# Patient Record
Sex: Male | Born: 1995 | Race: Asian | Hispanic: No | Marital: Single | Smoking: Never smoker
Health system: Southern US, Community
[De-identification: ages and names within clinical notes are randomized; demographics above are authoritative.]

---

## 2020-01-27 DIAGNOSIS — L2089 Other atopic dermatitis: Secondary | ICD-10-CM | POA: Diagnosis not present

## 2020-02-17 DIAGNOSIS — Z20822 Contact with and (suspected) exposure to covid-19: Secondary | ICD-10-CM | POA: Diagnosis not present

## 2020-03-05 DIAGNOSIS — Z20822 Contact with and (suspected) exposure to covid-19: Secondary | ICD-10-CM | POA: Diagnosis not present

## 2020-03-06 ENCOUNTER — Other Ambulatory Visit: Payer: Self-pay

## 2020-03-06 ENCOUNTER — Emergency Department (HOSPITAL_COMMUNITY)
Admission: EM | Admit: 2020-03-06 | Discharge: 2020-03-07 | Disposition: A | Discharge status: Home / Self Care | Payer: BC Managed Care – PPO | Attending: Emergency Medicine | Admitting: Emergency Medicine

## 2020-03-06 ENCOUNTER — Emergency Department (HOSPITAL_COMMUNITY): Payer: BC Managed Care – PPO

## 2020-03-06 ENCOUNTER — Encounter (HOSPITAL_COMMUNITY): Payer: Self-pay

## 2020-03-06 DIAGNOSIS — R630 Anorexia: Secondary | ICD-10-CM | POA: Diagnosis not present

## 2020-03-06 DIAGNOSIS — R1011 Right upper quadrant pain: Secondary | ICD-10-CM | POA: Diagnosis not present

## 2020-03-06 DIAGNOSIS — R109 Unspecified abdominal pain: Secondary | ICD-10-CM

## 2020-03-06 DIAGNOSIS — B349 Viral infection, unspecified: Secondary | ICD-10-CM | POA: Insufficient documentation

## 2020-03-06 DIAGNOSIS — R519 Headache, unspecified: Secondary | ICD-10-CM | POA: Insufficient documentation

## 2020-03-06 DIAGNOSIS — M791 Myalgia, unspecified site: Secondary | ICD-10-CM | POA: Diagnosis not present

## 2020-03-06 DIAGNOSIS — R110 Nausea: Secondary | ICD-10-CM | POA: Diagnosis not present

## 2020-03-06 DIAGNOSIS — Z03818 Encounter for observation for suspected exposure to other biological agents ruled out: Secondary | ICD-10-CM | POA: Diagnosis not present

## 2020-03-06 DIAGNOSIS — R100 Acute abdomen: Secondary | ICD-10-CM | POA: Diagnosis not present

## 2020-03-06 LAB — CBC
HCT: 50.9 % (ref 39.0–52.0)
Hemoglobin: 16.6 g/dL (ref 13.0–17.0)
MCH: 26.4 pg (ref 26.0–34.0)
MCHC: 32.6 g/dL (ref 30.0–36.0)
MCV: 81.1 fL (ref 80.0–100.0)
Platelets: 244 10*3/uL (ref 150–400)
RBC: 6.28 MIL/uL — ABNORMAL HIGH (ref 4.22–5.81)
RDW: 12.5 % (ref 11.5–15.5)
WBC: 6.3 10*3/uL (ref 4.0–10.5)
nRBC: 0 % (ref 0.0–0.2)

## 2020-03-06 LAB — COMPREHENSIVE METABOLIC PANEL
ALT: 21 U/L (ref 0–44)
AST: 23 U/L (ref 15–41)
Albumin: 4.8 g/dL (ref 3.5–5.0)
Alkaline Phosphatase: 36 U/L — ABNORMAL LOW (ref 38–126)
Anion gap: 12 (ref 5–15)
BUN: 12 mg/dL (ref 6–20)
CO2: 21 mmol/L — ABNORMAL LOW (ref 22–32)
Calcium: 9.2 mg/dL (ref 8.9–10.3)
Chloride: 105 mmol/L (ref 98–111)
Creatinine, Ser: 0.74 mg/dL (ref 0.61–1.24)
GFR, Estimated: >60 mL/min (ref >60)
Glucose, Bld: 92 mg/dL (ref 70–99)
Potassium: 3.2 mmol/L — ABNORMAL LOW (ref 3.5–5.1)
Sodium: 138 mmol/L (ref 135–145)
Total Bilirubin: 1.1 mg/dL (ref 0.3–1.2)
Total Protein: 7.5 g/dL (ref 6.5–8.1)

## 2020-03-06 LAB — URINALYSIS, ROUTINE W REFLEX MICROSCOPIC
Bilirubin Urine: NEGATIVE
Glucose, UA: NEGATIVE mg/dL
Hgb urine dipstick: NEGATIVE
Ketones, ur: 80 mg/dL — AB
Leukocytes,Ua: NEGATIVE
Nitrite: NEGATIVE
Protein, ur: NEGATIVE mg/dL
Specific Gravity, Urine: 1.017 (ref 1.005–1.030)
pH: 5 (ref 5.0–8.0)

## 2020-03-06 LAB — LIPASE, BLOOD: Lipase: 30 U/L (ref 11–51)

## 2020-03-06 MED ORDER — ACETAMINOPHEN 325 MG PO TABS: 650.0000 mg | ORAL_TABLET | Freq: Four times a day (QID) | ORAL | 0 refills | Status: AC | PRN

## 2020-03-06 MED ORDER — IOHEXOL 300 MG/ML  SOLN
100.0000 mL | Freq: Once | INTRAMUSCULAR | Status: AC | PRN
  Administered 2020-03-06: 100 mL via INTRAVENOUS

## 2020-03-06 MED ORDER — DICYCLOMINE HCL 20 MG PO TABS: 20.0000 mg | ORAL_TABLET | Freq: Two times a day (BID) | ORAL | 0 refills | Status: AC

## 2020-03-06 MED ORDER — IBUPROFEN 600 MG PO TABS: 600.0000 mg | ORAL_TABLET | Freq: Four times a day (QID) | ORAL | 0 refills | Status: AC | PRN

## 2020-03-06 MED ORDER — HYOSCYAMINE SULFATE 0.125 MG SL SUBL: 0.2500 mg | SUBLINGUAL_TABLET | Freq: Once | SUBLINGUAL | Status: DC

## 2020-03-06 MED ORDER — ALUM & MAG HYDROXIDE-SIMETH 200-200-20 MG/5ML PO SUSP: 30.0000 mL | Freq: Once | ORAL | Status: DC

## 2020-03-06 NOTE — ED Notes (Signed)
Pt ambulated from triage to room gait steady with no assistance.

## 2020-03-06 NOTE — ED Triage Notes (Signed)
States since Friday he has had fatigue, muscle aches, and abdominal pain. Seen at [facility] today and sent to ED.

## 2020-03-06 NOTE — ED Provider Notes (Signed)
[facility]-EMERGENCY DEPT Provider Note   CSN: [id number] Arrival date & time: 03/06/20  1907     History Chief Complaint  Patient presents with  . Abdominal Pain    Timothy Carroll is a 25 y.o. male reports no significant past medical history presenting to the emergency department with concern for abdominal pain, nausea, headache and myalgia.  He reports gradual onset of symptoms 3 days ago on Friday.  He describes a throbbing waxing and waning headache, generalized myalgias, muscle aches, abdominal pain that seems localized in the right upper quadrant, nausea, and poor appetite.  He reports some loose bowel movements but denies diarrhea or constipation.  He denies dysuria or history of kidney stones.  He denies history of abdominal surgery.  He reports that the headache and the whole-body symptoms appear to have improved, but the pain is abdomen is been persistent.  The pain is a squeezing and throbbing pain in the right upper quadrant.  Does not travel anywhere.  Nothing makes it better or worse.  It is constant.  He went to an urgent care today and was told to come to the ER for further evaluation.  He did have cupping performed at home by a family member. NKDA No baseline medications  HPI     History reviewed. No pertinent past medical history.  There are no problems to display for this patient.   History reviewed. No pertinent surgical history.     No family history on file.     Home Medications Prior to Admission medications   Medication Sig Start Date End Date Taking? Authorizing Provider  acetaminophen (TYLENOL) 325 MG tablet Take 2 tablets (650 mg total) by mouth every 6 (six) hours as needed for up to 30 doses for mild pain or moderate pain. 03/06/20  Yes Fraser Busche, Kermit Balo, MD  dicyclomine (BENTYL) 20 MG tablet Take 1 tablet (20 mg total) by mouth 2 (two) times daily for 10 days. 03/06/20 03/16/20 Yes Micajah Dennin, Kermit Balo, MD  ibuprofen (ADVIL) 600 MG  tablet Take 1 tablet (600 mg total) by mouth every 6 (six) hours as needed for up to 30 doses. 03/06/20  Yes Ruvim Risko, Kermit Balo, MD    Allergies    Shellfish allergy  Review of Systems   Review of Systems  Constitutional: Negative for chills and fever.  HENT: Negative for ear pain and sore throat.   Eyes: Negative for photophobia and visual disturbance.  Respiratory: Negative for cough and shortness of breath.   Cardiovascular: Negative for chest pain and palpitations.  Gastrointestinal: Positive for abdominal pain and nausea. Negative for vomiting.  Genitourinary: Negative for dysuria and hematuria.  Musculoskeletal: Positive for arthralgias and myalgias.  Skin: Negative for color change and rash.  Neurological: Positive for headaches. Negative for syncope.  All other systems reviewed and are negative.   Physical Exam Updated Vital Signs BP 133/81 (BP Location: Right Arm)   Pulse 76   Temp 98.2 F (36.8 C) (Oral)   Resp 17   SpO2 100%   Physical Exam Constitutional:      General: He is not in acute distress. HENT:     Head: Normocephalic and atraumatic.  Eyes:     Conjunctiva/sclera: Conjunctivae normal.     Pupils: Pupils are equal, round, and reactive to light.  Cardiovascular:     Rate and Rhythm: Normal rate and regular rhythm.  Pulmonary:     Effort: Pulmonary effort is normal. No respiratory distress.  Abdominal:  General: There is no distension.     Tenderness: There is abdominal tenderness in the right upper quadrant. There is no guarding or rebound. Positive signs include Murphy's sign. Negative signs include McBurney's sign.     Comments: Mild tenderness Cupping marks on skin of abdomen  Skin:    General: Skin is warm and dry.  Neurological:     General: No focal deficit present.     Mental Status: He is alert. Mental status is at baseline.  Psychiatric:        Mood and Affect: Mood normal.        Behavior: Behavior normal.     ED Results /  Procedures / Treatments   Labs (all labs ordered are listed, but only abnormal results are displayed) Labs Reviewed  COMPREHENSIVE METABOLIC PANEL - Abnormal; Notable for the following components:      Result Value   Potassium 3.2 (*)    CO2 21 (*)    Alkaline Phosphatase 36 (*)    All other components within normal limits  CBC - Abnormal; Notable for the following components:   RBC 6.28 (*)    All other components within normal limits  URINALYSIS, ROUTINE W REFLEX MICROSCOPIC - Abnormal; Notable for the following components:   Ketones, ur 80 (*)    All other components within normal limits  LIPASE, BLOOD    EKG None  Radiology CT ABDOMEN PELVIS W CONTRAST  Result Date: 03/06/2020 CLINICAL DATA:  Right-sided abdominal pain.  Nausea. EXAM: CT ABDOMEN AND PELVIS WITH CONTRAST TECHNIQUE: Multidetector CT imaging of the abdomen and pelvis was performed using the standard protocol following bolus administration of intravenous contrast. CONTRAST:  OMNIPAQUE IOHEXOL 300 MG/ML  SOLN COMPARISON:  None. FINDINGS: Lower chest: Lung bases are clear. Hepatobiliary: No focal liver abnormality is seen. No gallstones, gallbladder wall thickening, or biliary dilatation. Pancreas: No ductal dilatation or inflammation. Spleen: Normal in size without focal abnormality. Adrenals/Urinary Tract: Normal adrenal glands. No hydronephrosis or perinephric edema. Homogeneous renal enhancement. No visualized renal calculi. Urinary bladder is empty but grossly normal. Stomach/Bowel: There is a 12 mm appendicolith in the distal appendix, series 5, image 37. No adjacent fat stranding or inflammation. More proximal appendix is normal, air-filled and nondilated, series 5 images 43-45. There is no terminal ileal inflammation. Majority of the small bowel is decompressed. No bowel obstruction or evidence of inflammation. Stomach is unremarkable. No colonic wall thickening or inflammation. Small volume colonic stool.  Vascular/Lymphatic: Normal caliber abdominal aorta. Patent portal and splenic veins. No acute vascular findings. No adenopathy. Reproductive: Prostate is unremarkable. Other: No free air, free fluid, or intra-abdominal fluid collection. No abdominal wall hernia. Musculoskeletal: There are no acute or suspicious osseous abnormalities. IMPRESSION: 1. No acute findings or explanation for right-sided pain. 2. Appendicolith in the distal appendix, which is otherwise normal. No evidence of appendicitis or inflammation. Electronically Signed   By: Narda Rutherford M.D.   On: 03/06/2020 23:31    Procedures Procedures   Medications Ordered in ED Medications  iohexol (OMNIPAQUE) 300 MG/ML solution 100 mL (100 mLs Intravenous Contrast Given 03/06/20 2313)    ED Course  I have reviewed the triage vital signs and the nursing notes.  Pertinent labs & imaging results that were available during my care of the patient were reviewed by me and considered in my medical decision making (see chart for details).  This patient presents to the Emergency Department with complaint of abdominal pain. This involves an extensive number of  treatment options, and is a complaint that carries with it a high risk of complications and morbidity.  The differential diagnosis includes gastritis vs constipation vs inflammatory bowel disease vs intraabdominal infection vs biliary disease vs other  I ordered, reviewed, and interpreted labs, including CMP, CBC, and lipase There were no immediate, life-threatening emergencies found in this labwork.  Patient's UA showed no signs of infection.  Possible viral syndrome (?) - he had a negative covid test at urgent care today.  No respiratory complaints.  Lower suspicion for acute pancreatitis, significant infection, or bowel obstruction.  With tenderness on exam I've ordered CT abdomen /pelvis to evaluate for biliary disease or appendicitis - although his tenderness does appear more  localized to the RUQ, without focal ttp at mcburney's point  I reviewed the imaging studies and note the appendicolith on imaging, no evidence biliary disease, no visible kidney stones, no other sign of emergent intraabdominal process.  With symptoms ongoing for 3 days, no SIRS criteria or leukocytosis, and no isolated RLQ ttp, I have a lower suspicion for acute appendicitis.  I think this can be managed conservatively at home.  We can try bentyl for a few days.    Clinical Course as of 03/07/20 0942  Mon Mar 06, 2020  2349 Pt reassessed, still appears comfortable, vitals wnl.  Doubt acute appendicitis.  On reassessment no RLQ ttp.  We did discuss return precautions with specific attention to early appendicitis, but at this time I think he's stable for discharge.  With his headache, muscle aches, this is more likely a viral syndrome - I expect improvement in the next few days.   [MT]    Clinical Course User Index [MT] Amaal Dimartino, Kermit Balo, MD    Final Clinical Impression(s) / ED Diagnoses Final diagnoses:  Abdominal pain, unspecified abdominal location  Viral syndrome  Nonintractable headache, unspecified chronicity pattern, unspecified headache type    Rx / DC Orders ED Discharge Orders         Ordered    dicyclomine (BENTYL) 20 MG tablet  2 times daily        03/06/20 2349    acetaminophen (TYLENOL) 325 MG tablet  Every 6 hours PRN        03/06/20 2349    ibuprofen (ADVIL) 600 MG tablet  Every 6 hours PRN        03/06/20 2349           Terald Sleeper, MD 03/07/20 [phone]

## 2020-03-06 NOTE — Discharge Instructions (Signed)
You likely have a viral syndrome.  This usually lasts 7 days for most viruses, but can be 2 weeks with Covid.    At home keep drinking water, eat what you are able.  You can take motrin and tylenol together for muscle aches, headaches, and pain.  You can take bentyl for abdominal pain.  We talked about signs and symptoms of appendicitis.  We do not see evidence of this at this on your CT scan or bloodtests.  However if you start having constant pain in your right lower abdomen (where I showed you), with nausea, vomiting, or fevers, you should return to the ER.

## 2020-03-28 DIAGNOSIS — F419 Anxiety disorder, unspecified: Secondary | ICD-10-CM | POA: Diagnosis not present

## 2020-03-28 DIAGNOSIS — F322 Major depressive disorder, single episode, severe without psychotic features: Secondary | ICD-10-CM | POA: Diagnosis not present

## 2020-06-22 DIAGNOSIS — F32A Depression, unspecified: Secondary | ICD-10-CM | POA: Diagnosis not present

## 2020-06-22 DIAGNOSIS — G4700 Insomnia, unspecified: Secondary | ICD-10-CM | POA: Diagnosis not present

## 2020-07-21 DIAGNOSIS — R519 Headache, unspecified: Secondary | ICD-10-CM | POA: Diagnosis not present

## 2020-07-27 ENCOUNTER — Encounter: Payer: Self-pay | Admitting: Neurology

## 2020-10-16 NOTE — Progress Notes (Signed)
NEUROLOGY CONSULTATION NOTE  Timothy Carroll MRN: [id number] DOB: 19-Mar-1995  Referring provider: Carolin Coy, MD Primary care provider: Carolin Coy, MD  Reason for consult:  headache  Assessment/Plan:   New onset headaches Major depressive disorder, recurrent, moderate Anxiety Tremor Multiple somatic complaints History of seizure - semiology does not really correlate with an epileptic event but will evaluate further   At this time, we will rule out secondary intracranial etiology.  If unremarkable, then the headaches as well as his other somatic complaints may be related to underlying depression and anxiety.  MRI of brain with and without contrast Routine EEG Declines starting a headache preventative at this time May continue to use Advil as needed but limit to no more than 2 days out of week to prevent rebound headache Follow up after testing    Subjective:  Timothy Carroll is a 25 year old male who presents for headache.  History supplemented by referring provider's note.  In February, he started experiencing new headaches.  It correlated with increased emotional stress and insomnia which he related to his job.  He believes that he may have had a "mental breakdown".  He reports a mild to severe sharp and occasionally electric pain across the forehead and top of head.  This is associated with paresthesias on his head and"brain fog" (trouble focusing/concentrating)  Onset:  February - mental breakdown, fatigue, abdominal pain - had to leave job - April depression improved but headaches persisted - June-July feels shaky or tremulousness and lump in throat, head shake Location:  top of head, forehead He denies associated nausea, vomiting, photophobia, phonophobia, visual disturbance, unilateral numbness or weakness.  They last from an hour to several hours.  They occur daily but are severe 1 to 2 days a month.  He only treats with Advil for severe pain.  He also endorses  other symptoms.  He sometimes has "panic attacks" with palpitations.  He has abdominal pain which was reportedly worked up and was negative.  He also feels a lump in his throat. If he swallows, his head may shake.  He sometimes feels his body shaking.  He reports extreme fatigue.  He was found to have mild OSA but fatigue and headaches persisted despite use of CPAP.  He reports that he had a "seizure" in 2015 while in [place].  He had full body shaking but no loss of consciousness.  He was told it was due to heat stroke.  He hasn't had a recurrent spell.  Current NSAIDS/analgesics:  Advil (a couple of times a month) Current triptans:  none Current ergotamine:  none Current anti-emetic:  none Current muscle relaxants:  none Current Antihypertensive medications:  none Current Antidepressant medications:  none Current Anticonvulsant medications:  none Current anti-CGRP:  none Current Vitamins/Herbal/Supplements:  none Current Antihistamines/Decongestants:  none Other therapy:  none Hormone/birth control:  none  Past NSAIDS/analgesics:  Aleve, ibuprofen, Tylenol Past abortive triptans:  none Past abortive ergotamine:  none Past muscle relaxants:  none Past anti-emetic:  none Past antihypertensive medications:  none Past antidepressant medications:  none Past anticonvulsant medications:  none Past anti-CGRP:  none Past vitamins/Herbal/Supplements:  none Past antihistamines/decongestants:  none Other past therapies:  acupuncture   Depression:  Yes; Anxiety:  Yes Sleep hygiene:  Poor.  Has mild OSA.  Uses CPAP.  Continues to feel daytime fatigue  Family history of headache:  No.  No family history of seizures.  Labs from June:  TSH 0.62; CMP with Na 137, K 4.2,  Cl 104, CO2 10.1, glucose 89, BUN 14, Cr 0.84, t bili 0.6, ALP 31, AST 19, ALT 16; CBC with WBC 5.2, HGB 16, HCT 47.9, PLT 274   PAST MEDICAL HISTORY: No past medical history on file.   PAST SURGICAL HISTORY: No past  surgical history on file.  MEDICATIONS: Current Outpatient Medications on File Prior to Visit  Medication Sig Dispense Refill   acetaminophen (TYLENOL) 325 MG tablet Take 2 tablets (650 mg total) by mouth every 6 (six) hours as needed for up to 30 doses for mild pain or moderate pain. 30 tablet 0   dicyclomine (BENTYL) 20 MG tablet Take 1 tablet (20 mg total) by mouth 2 (two) times daily for 10 days. 20 tablet 0   ibuprofen (ADVIL) 600 MG tablet Take 1 tablet (600 mg total) by mouth every 6 (six) hours as needed for up to 30 doses. 30 tablet 0   No current facility-administered medications on file prior to visit.    ALLERGIES: Allergies  Allergen Reactions   Shellfish Allergy Shortness Of Breath and Rash    FAMILY HISTORY: No family history on file.  Objective:  Blood pressure 119/83, pulse 76, height 5\' 4"  (1.626 m), weight 128 lb 6.4 oz (58.2 kg), SpO2 98 %. General: No acute distress.  Patient appears well-groomed.   Head:  Normocephalic/atraumatic Eyes:  fundi examined but not visualized Neck: supple, no paraspinal tenderness, full range of motion Back: No paraspinal tenderness Heart: regular rate and rhythm Lungs: Clear to auscultation bilaterally. Vascular: No carotid bruits. Neurological Exam: Mental status: alert and oriented to person, place, and time, recent and remote memory intact, fund of knowledge intact, attention and concentration intact, speech fluent and not dysarthric, language intact. Cranial nerves: CN I: not tested CN II: pupils equal, round and reactive to light, visual fields intact CN III, IV, VI:  full range of motion, no nystagmus, no ptosis CN V: facial sensation intact. CN VII: upper and lower face symmetric CN VIII: hearing intact CN IX, X: gag intact, uvula midline CN XI: sternocleidomastoid and trapezius muscles intact CN XII: tongue midline Bulk & Tone: normal, no fasciculations. Motor:  muscle strength 5/5 throughout Sensation:  Pinprick,  temperature and vibratory sensation intact. Deep Tendon Reflexes:  2+ throughout,  toes downgoing.   Finger to nose testing:  Without dysmetria.   Heel to shin:  Without dysmetria.   Gait:  Normal station and stride.  Romberg negative.    Thank you for allowing me to take part in the care of this patient.  , DO  CC: Shon Millet, MD

## 2020-10-17 ENCOUNTER — Other Ambulatory Visit: Payer: Self-pay

## 2020-10-17 ENCOUNTER — Ambulatory Visit (INDEPENDENT_AMBULATORY_CARE_PROVIDER_SITE_OTHER): Payer: BC Managed Care – PPO | Admitting: Neurology

## 2020-10-17 ENCOUNTER — Encounter: Payer: Self-pay | Admitting: Neurology

## 2020-10-17 VITALS — BP 119/83 | HR 76 | Ht 64.0 in | Wt 128.4 lb

## 2020-10-17 DIAGNOSIS — R519 Headache, unspecified: Secondary | ICD-10-CM | POA: Diagnosis not present

## 2020-10-17 DIAGNOSIS — R251 Tremor, unspecified: Secondary | ICD-10-CM | POA: Diagnosis not present

## 2020-10-17 DIAGNOSIS — F419 Anxiety disorder, unspecified: Secondary | ICD-10-CM

## 2020-10-17 DIAGNOSIS — Z87898 Personal history of other specified conditions: Secondary | ICD-10-CM | POA: Diagnosis not present

## 2020-10-17 DIAGNOSIS — F339 Major depressive disorder, recurrent, unspecified: Secondary | ICD-10-CM

## 2020-10-17 NOTE — Patient Instructions (Signed)
Check MRI of brain with and without contrast Routine EEG Follow up after testing.

## 2020-10-26 ENCOUNTER — Other Ambulatory Visit: Payer: Self-pay

## 2020-10-26 ENCOUNTER — Ambulatory Visit (INDEPENDENT_AMBULATORY_CARE_PROVIDER_SITE_OTHER): Payer: BC Managed Care – PPO | Admitting: Neurology

## 2020-10-26 DIAGNOSIS — Z87898 Personal history of other specified conditions: Secondary | ICD-10-CM

## 2020-10-26 NOTE — Progress Notes (Signed)
Pt advised EEG is normal.

## 2020-10-26 NOTE — Procedures (Signed)
ELECTROENCEPHALOGRAM REPORT  Date of Study: 10/26/2020  Patient's Name: Timothy Carroll MRN: [id number] Date of Birth: 1995/12/01  Referring Provider: Darrow Bussing, MD  Clinical History: 25 year old male with headaches, depression/anxiety and reported history of isolated seizure presents for limb shaking.  Medications: TYLENOL 325 MG tablet BENTYL 20 MG tablet ADVIL 600 MG tablet  Technical Summary: A multichannel digital EEG recording measured by the international 10-20 system with electrodes applied with paste and impedances below 5000 ohms performed in our laboratory with EKG monitoring in an awake and asleep patient.  Photic stimulation was performed.  The digital EEG was referentially recorded, reformatted, and digitally filtered in a variety of bipolar and referential montages for optimal display.    Description: The patient is awake and asleep during the recording.  During maximal wakefulness, there is a symmetric, medium voltage 11 Hz posterior dominant rhythm that attenuates with eye opening.  The record is symmetric.  During drowsiness and sleep, there is an increase in theta slowing of the background.  Vertex waves and symmetric sleep spindles were seen.  Photic stimulation did not elicit any abnormalities.  There were no epileptiform discharges or electrographic seizures seen.    EKG lead was unremarkable.  Impression: This awake and asleep EEG is normal.    Clinical Correlation: A normal EEG does not exclude a clinical diagnosis of epilepsy.  If further clinical questions remain, prolonged EEG may be helpful.  Clinical correlation is advised.   Shon Millet, DO

## 2020-11-04 ENCOUNTER — Ambulatory Visit
Admission: RE | Admit: 2020-11-04 | Discharge: 2020-11-04 | Disposition: A | Discharge status: Home / Self Care | Payer: BC Managed Care – PPO | Source: Ambulatory Visit | Attending: Neurology | Admitting: Neurology

## 2020-11-04 ENCOUNTER — Other Ambulatory Visit: Payer: Self-pay

## 2020-11-04 DIAGNOSIS — R519 Headache, unspecified: Secondary | ICD-10-CM | POA: Diagnosis not present

## 2020-11-04 DIAGNOSIS — R251 Tremor, unspecified: Secondary | ICD-10-CM

## 2020-11-04 MED ORDER — GADOBENATE DIMEGLUMINE 529 MG/ML IV SOLN
12.0000 mL | Freq: Once | INTRAVENOUS | Status: AC | PRN
  Administered 2020-11-04: 12 mL via INTRAVENOUS

## 2020-11-10 ENCOUNTER — Telehealth: Payer: Self-pay | Admitting: Neurology

## 2020-11-10 NOTE — Telephone Encounter (Signed)
Pt wants to speak with the doctor regarding his results. He does not want to speak with the nurse. He didn't understand some things that were said.

## 2020-11-13 NOTE — Telephone Encounter (Signed)
Patient has been contacted via Mychart by Dr. Everlena Cooper and Zenon Mayo

## 2020-11-15 ENCOUNTER — Other Ambulatory Visit: Payer: Self-pay

## 2020-11-15 DIAGNOSIS — R251 Tremor, unspecified: Secondary | ICD-10-CM

## 2020-11-15 DIAGNOSIS — Z87898 Personal history of other specified conditions: Secondary | ICD-10-CM

## 2020-11-15 DIAGNOSIS — R519 Headache, unspecified: Secondary | ICD-10-CM

## 2020-11-15 NOTE — Progress Notes (Signed)
Pt advised of Dr.Jaffe note, I tried calling to speak to Timothy Carroll about the MRI results but had to leave a voice message.  As previously stated, I suspect that the findings on the MRI are of no clinical significance.  The signal changes involve the basal ganglia which may cause tremor.  However, based on my observation, I don't think it would be the cause of the tremors he is experiencing.  I do not think the MRI findings are the cause of any of his current symptoms.  That being said, I do not want to overlook anything.  Therefore, I would still like to repeat an MRI of the brain with and without contrast in one year to make sure there aren't any changes.  We can discuss further at his follow up. He has a follow up in May but I recommend that he be placed on the cancellation list as he would then surely be moved up.        MRI Of brain ordered

## 2021-05-22 NOTE — Progress Notes (Signed)
? ?NEUROLOGY FOLLOW UP OFFICE NOTE ? ?[place] ?[id number] ? ?Assessment/Plan:  ? ?Tremor - semiology seems psychogenic.  MRI brain findings showed signal changes in the bilateral globus palladi which I think are not clinically relevant.  Symptoms are not consistent with NBIA (as considered in MRI report) and tremor does not appear to be physiologic. ?Multiple somatic complaints ?History of seizure - semiology does not really correlate with an epileptic event but will evaluate further ? ?I do not think his symptoms are neurologic.  I discussed that they may be related to anxiety but should follow up with his PCP if he wishes to be evaluated for other etiology.  ?  ?Subjective:  ?Timothy Carroll is a 26 year old male who follows up for headache and tremor.   ? ?UPDATE: ?EEG on 10/26/2020 was normal.  MRI of brain with and without contrast on 11/04/2020 personally reviewed showed susceptibility artifact within the bilateral globus palladi.  Headaches are improved.  However sometimes notes chest pain or throat pain.  Will still have some shaking episodes. ?  ?HISTORY: ?In February 2022, he started experiencing new headaches.  It correlated with increased emotional stress and insomnia which he related to his job.  He believes that he may have had a "mental breakdown".  He also had fatigue and abdominal pain.  He reports a mild to severe sharp and occasionally electric pain across the forehead and top of head.  This is associated with paresthesias on his head and"brain fog" (trouble focusing/concentrating).  He left his job and depression improved but headaches persisted.  He denies associated nausea, vomiting, photophobia, phonophobia, visual disturbance, unilateral numbness or weakness.  They last from an hour to several hours.  They occur daily but are severe 1 to 2 days a month.  He only treats with Advil for severe pain.  He also endorses other symptoms.  He sometimes has "panic attacks" with palpitations.  He has  abdominal pain which was reportedly worked up and was negative.  He also feels a lump in his throat. If he swallows, his head may shake.  He sometimes feels his body shaking.  He reports extreme fatigue.  He was found to have mild OSA but fatigue and headaches persisted despite use of CPAP. ?  ?He reports that he had a "seizure" in 2015 while in [place].  He had full body shaking but no loss of consciousness.  He was told it was due to heat stroke.  He hasn't had a recurrent spell. ?  ?Current NSAIDS/analgesics:  Advil (a couple of times a month) ?Current triptans:  none ?Current ergotamine:  none ?Current anti-emetic:  none ?Current muscle relaxants:  none ?Current Antihypertensive medications:  none ?Current Antidepressant medications:  none ?Current Anticonvulsant medications:  none ?Current anti-CGRP:  none ?Current Vitamins/Herbal/Supplements:  none ?Current Antihistamines/Decongestants:  none ?Other therapy:  none ?Hormone/birth control:  none ?  ?Past NSAIDS/analgesics:  Aleve, ibuprofen, Tylenol ?Past abortive triptans:  none ?Past abortive ergotamine:  none ?Past muscle relaxants:  none ?Past anti-emetic:  none ?Past antihypertensive medications:  none ?Past antidepressant medications:  none ?Past anticonvulsant medications:  none ?Past anti-CGRP:  none ?Past vitamins/Herbal/Supplements:  none ?Past antihistamines/decongestants:  none ?Other past therapies:  acupuncture ?  ?  ?Depression:  Yes; Anxiety:  Yes ?Sleep hygiene:  Poor.  Has mild OSA.  Uses CPAP.  Continues to feel daytime fatigue ?  ?Family history of headache:  No.  No family history of seizures. ? ?PAST MEDICAL HISTORY: ?No past  medical history on file. ? ?MEDICATIONS: ?Current Outpatient Medications on File Prior to Visit  ?Medication Sig Dispense Refill  ? acetaminophen (TYLENOL) 325 MG tablet Take 2 tablets (650 mg total) by mouth every 6 (six) hours as needed for up to 30 doses for mild pain or moderate pain. (Patient not taking: Reported  on 10/17/2020) 30 tablet 0  ? dicyclomine (BENTYL) 20 MG tablet Take 1 tablet (20 mg total) by mouth 2 (two) times daily for 10 days. 20 tablet 0  ? ibuprofen (ADVIL) 600 MG tablet Take 1 tablet (600 mg total) by mouth every 6 (six) hours as needed for up to 30 doses. (Patient not taking: Reported on 10/17/2020) 30 tablet 0  ? ?No current facility-administered medications on file prior to visit.  ? ? ?ALLERGIES: ?Allergies  ?Allergen Reactions  ? Shellfish Allergy Shortness Of Breath and Rash  ? ? ?FAMILY HISTORY: ?No family history on file. ? ?  ?Objective:  ?Blood pressure 118/80, pulse 75, height 5\' 4"  (1.626 m), weight 127 lb 6.4 oz (57.8 kg), SpO2 99 %. ?General: No acute distress.  Patient appears well-groomed.   ?Head:  Normocephalic/atraumatic ?Eyes:  Fundi examined but not visualized ?Neck: supple, no paraspinal tenderness, full range of motion ?Heart:  Regular rate and rhythm ?Lungs:  Clear to auscultation bilaterally ?Back: No paraspinal tenderness ?Neurological Exam: alert and oriented to person, place, and time.  Speech fluent and not dysarthric, language intact.  CN II-XII intact. Bulk and tone normal, muscle strength 5/5 throughout.  Head briefly shook at one point.  Sensation to light touch intact.  Deep tendon reflexes 2+ throughout, toes downgoing.  Finger to nose testing intact.  Gait normal, Romberg negative. ? ? ? , DO ? ?CC: Dibas Koirala, MD ? ? ? ? ? ? ?

## 2021-05-24 ENCOUNTER — Ambulatory Visit (INDEPENDENT_AMBULATORY_CARE_PROVIDER_SITE_OTHER): Payer: BC Managed Care – PPO | Admitting: Neurology

## 2021-05-24 ENCOUNTER — Encounter: Payer: Self-pay | Admitting: Neurology

## 2021-05-24 VITALS — BP 118/80 | HR 75 | Ht 64.0 in | Wt 127.4 lb

## 2021-05-24 DIAGNOSIS — R251 Tremor, unspecified: Secondary | ICD-10-CM | POA: Diagnosis not present

## 2021-05-24 NOTE — Patient Instructions (Signed)
I don't think your symptoms are neurologic.  It may be related to anxiety but I would discuss with your PCP in case it could be due to something else. ?

## 2021-07-18 ENCOUNTER — Other Ambulatory Visit: Payer: Self-pay | Admitting: Neurology

## 2021-07-18 DIAGNOSIS — R251 Tremor, unspecified: Secondary | ICD-10-CM

## 2021-07-18 DIAGNOSIS — Z87898 Personal history of other specified conditions: Secondary | ICD-10-CM

## 2021-07-18 DIAGNOSIS — R519 Headache, unspecified: Secondary | ICD-10-CM

## 2021-09-27 DIAGNOSIS — K649 Unspecified hemorrhoids: Secondary | ICD-10-CM | POA: Diagnosis not present

## 2021-10-30 ENCOUNTER — Other Ambulatory Visit: Payer: BC Managed Care – PPO

## 2021-12-04 ENCOUNTER — Encounter: Payer: Self-pay | Admitting: Neurology

## 2021-12-07 ENCOUNTER — Other Ambulatory Visit: Payer: BC Managed Care – PPO

## 2022-01-19 IMAGING — CT CT ABD-PELV W/ CM
2 of 4 series · 16 of 46 positions shown, 18 images · IV contrast (OMNIPAQUE 300)
Comparison: None.

CLINICAL DATA: Right-sided abdominal pain.  Nausea.

EXAM:
CT ABDOMEN AND PELVIS WITH CONTRAST
TECHNIQUE: Multidetector CT imaging of the abdomen and pelvis was performed
using the standard protocol following bolus administration of
intravenous contrast.
CONTRAST:  100mL OMNIPAQUE IOHEXOL 300 MG/ML  SOLN

[Series 2: axial st · axial · 0.68mm/px · z∈[+1208,+1598]mm · 13 of 88 slices shown, 15 images]
[im 5/88  soft-tissue]
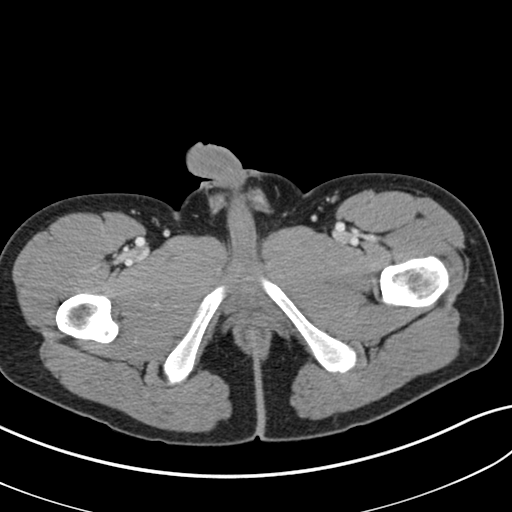
[im 5/88  bone]
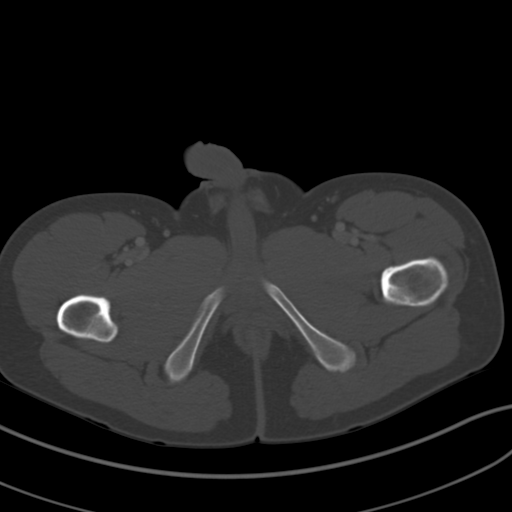
[im 14/88  soft-tissue]
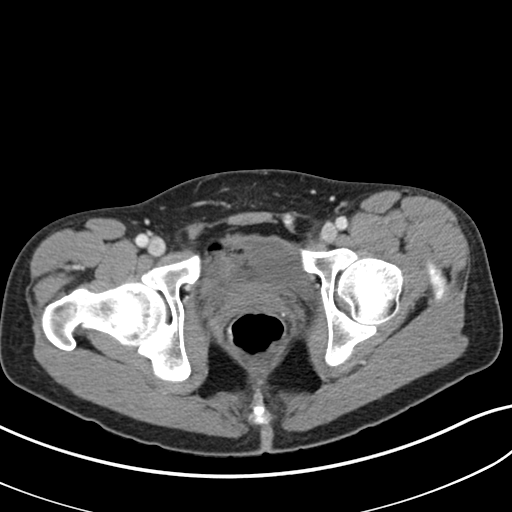
[im 19/88  soft-tissue]
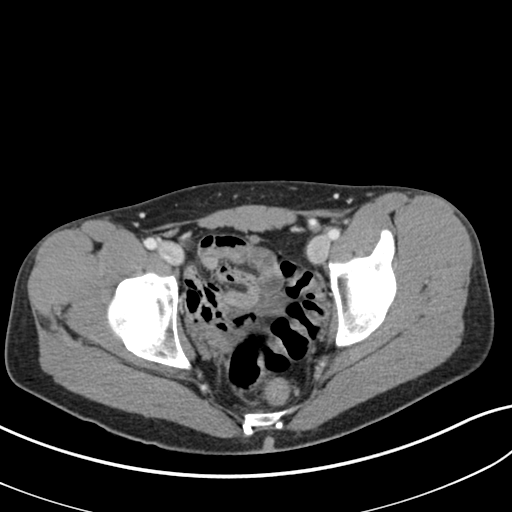
[im 23/88  soft-tissue]
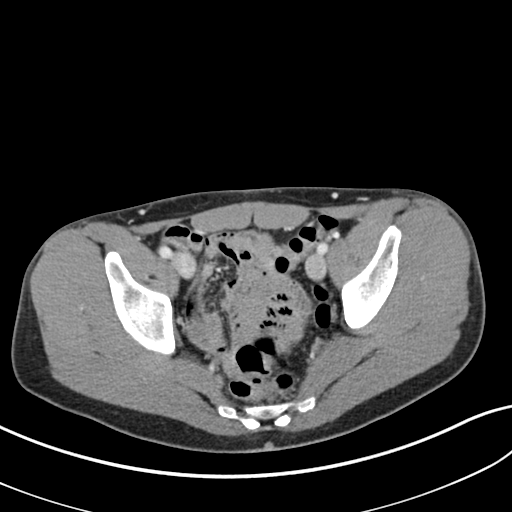
[im 33/88  soft-tissue]
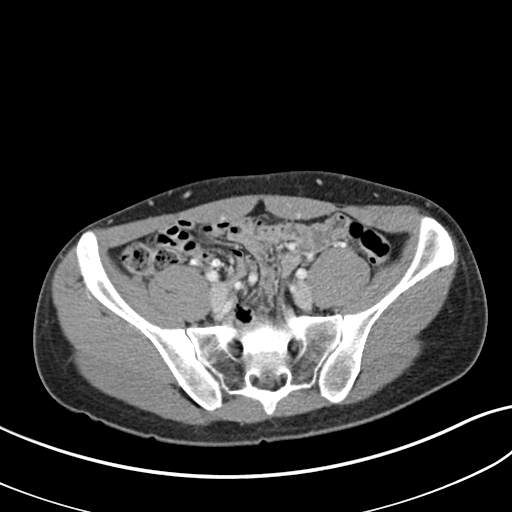
[im 37/88  soft-tissue]
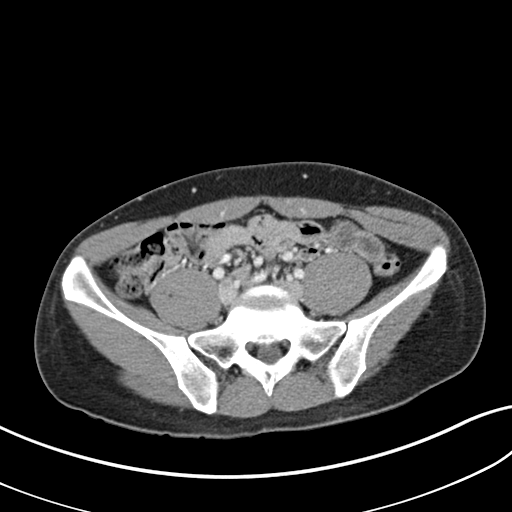
[im 46/88  soft-tissue]
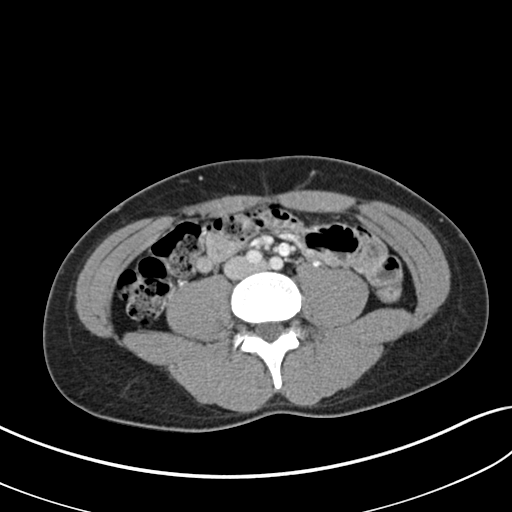
[im 51/88  soft-tissue]
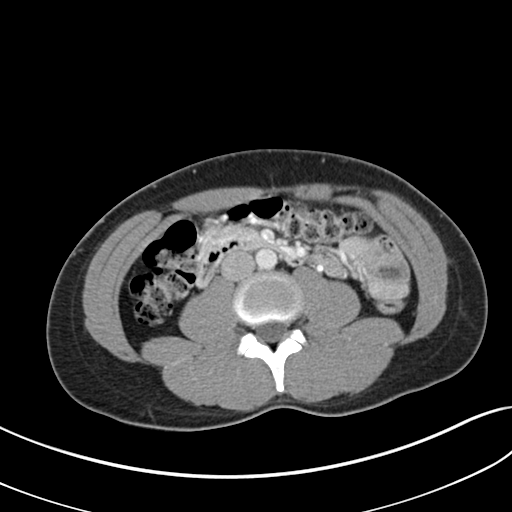
[im 55/88  soft-tissue]
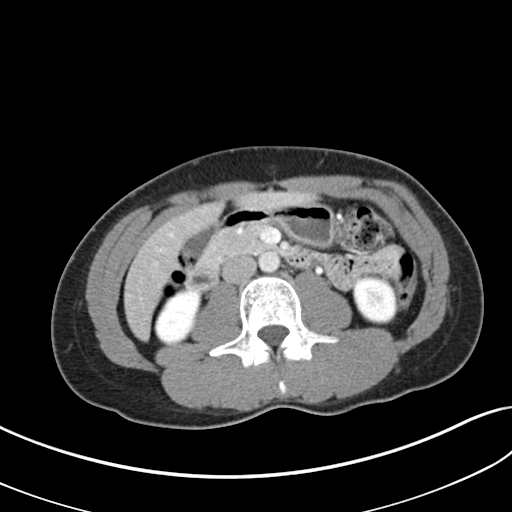
[im 55/88  bone]
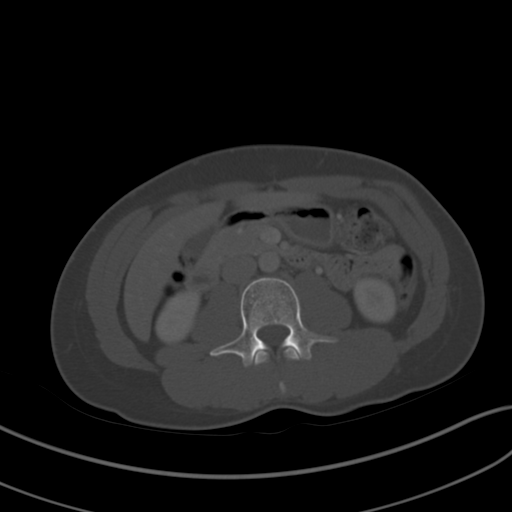
[im 65/88  soft-tissue]
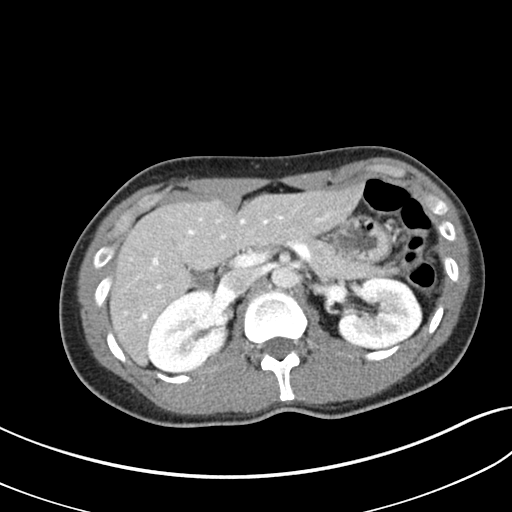
[im 69/88  soft-tissue]
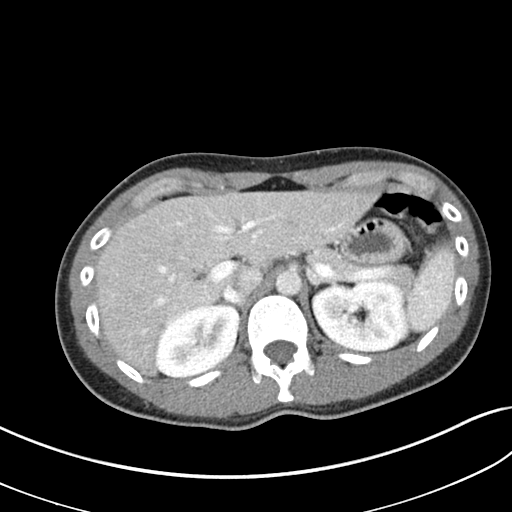
[im 74/88  soft-tissue]
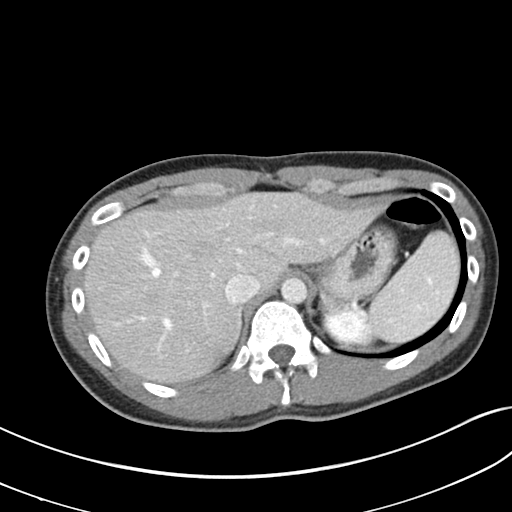
[im 83/88  soft-tissue]
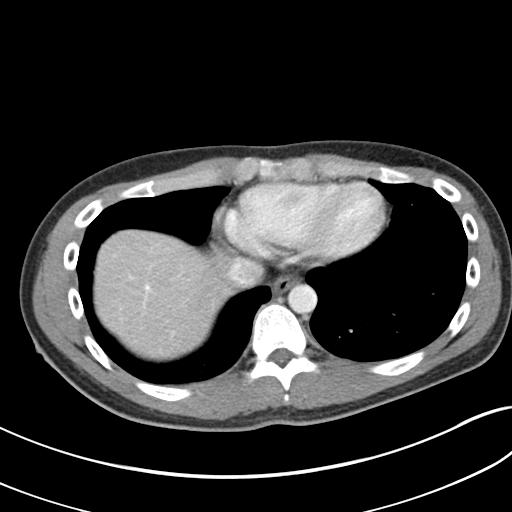

[Series 5: coronal st · coronal · 0.65mm/px · 3 of 106 slices shown]
[im 36/106  soft-tissue]
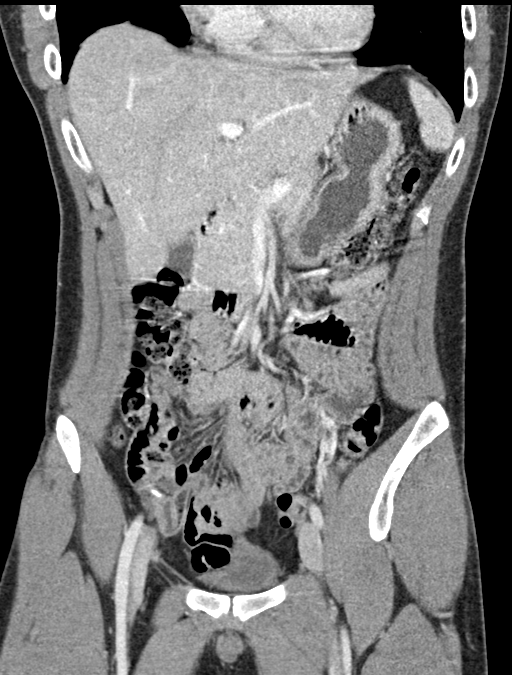
[im 47/106  soft-tissue]
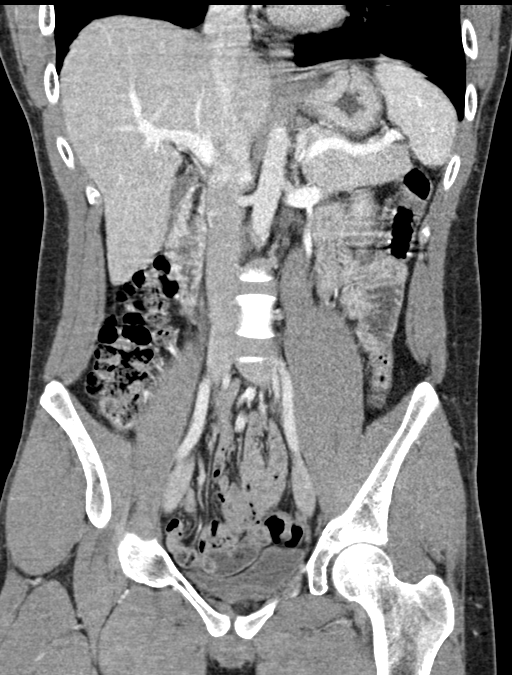
[im 59/106  soft-tissue]
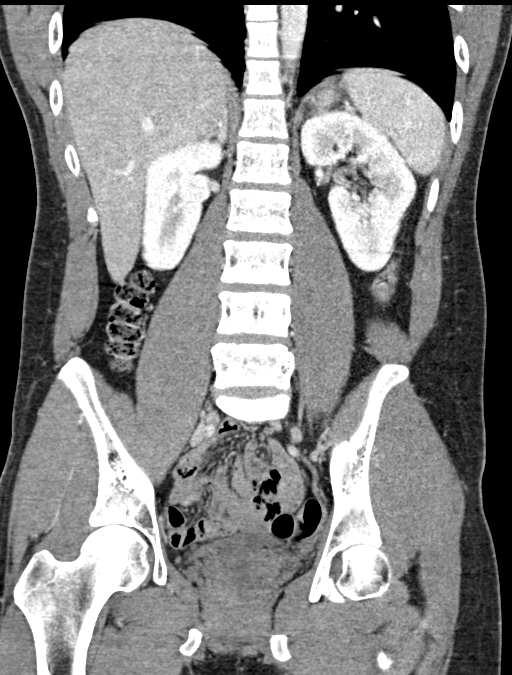

[16 of 46 positions shown; findings below may reference images not displayed]

FINDINGS: Lower chest: Lung bases are clear.

Hepatobiliary: No focal liver abnormality is seen. No gallstones,
gallbladder wall thickening, or biliary dilatation.

Pancreas: No ductal dilatation or inflammation.

Spleen: Normal in size without focal abnormality.

Adrenals/Urinary Tract: Normal adrenal glands. No hydronephrosis or
perinephric edema. Homogeneous renal enhancement. No visualized
renal calculi. Urinary bladder is empty but grossly normal.

Stomach/Bowel: There is a 12 mm appendicolith in the distal
appendix, series 5, image 37. No adjacent fat stranding or
inflammation. More proximal appendix is normal, air-filled and
nondilated, series 5 images 43-45. There is no terminal ileal
inflammation. Majority of the small bowel is decompressed. No bowel
obstruction or evidence of inflammation. Stomach is unremarkable. No
colonic wall thickening or inflammation. Small volume colonic stool.

Vascular/Lymphatic: Normal caliber abdominal aorta. Patent portal
and splenic veins. No acute vascular findings. No adenopathy.

Reproductive: Prostate is unremarkable.

Other: No free air, free fluid, or intra-abdominal fluid collection.
No abdominal wall hernia.

Musculoskeletal: There are no acute or suspicious osseous
abnormalities.
IMPRESSION: 1. No acute findings or explanation for right-sided pain.
2. Appendicolith in the distal appendix, which is otherwise normal.
No evidence of appendicitis or inflammation.
